# Patient Record
Sex: Female | Born: 2005 | Hispanic: Yes | Marital: Single
Health system: Southern US, Community
[De-identification: ages and names within clinical notes are randomized; demographics above are authoritative.]

## PROBLEM LIST (undated history)

## (undated) DIAGNOSIS — O24419 Gestational diabetes mellitus in pregnancy, unspecified control: Secondary | ICD-10-CM

## (undated) HISTORY — DX: Gestational diabetes mellitus in pregnancy, unspecified control: O24.419

---
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
## 2023-01-08 ENCOUNTER — Encounter: Payer: Self-pay | Admitting: Emergency Medicine

## 2023-01-08 ENCOUNTER — Emergency Department
Admission: EM | Admit: 2023-01-08 | Discharge: 2023-01-08 | Disposition: A | Discharge status: Home / Self Care | Payer: Medicaid Other | Attending: Emergency Medicine | Admitting: Emergency Medicine

## 2023-01-08 DIAGNOSIS — B009 Herpesviral infection, unspecified: Secondary | ICD-10-CM

## 2023-01-08 DIAGNOSIS — N3000 Acute cystitis without hematuria: Secondary | ICD-10-CM

## 2023-01-08 DIAGNOSIS — N898 Other specified noninflammatory disorders of vagina: Secondary | ICD-10-CM

## 2023-01-08 LAB — URINALYSIS, ROUTINE W REFLEX MICROSCOPIC
Bilirubin Urine: NEGATIVE
Glucose, UA: NEGATIVE mg/dL
Ketones, ur: NEGATIVE mg/dL
Nitrite: NEGATIVE
Protein, ur: 30 mg/dL — AB
Specific Gravity, Urine: 1.011 (ref 1.005–1.030)
WBC, UA: >50 WBC/hpf — ABNORMAL HIGH (ref 0–5)
pH: 6 (ref 5.0–8.0)

## 2023-01-08 LAB — CHLAMYDIA/NGC RT PCR (ARMC ONLY)
Chlamydia Tr: NOT DETECTED
N gonorrhoeae: NOT DETECTED

## 2023-01-08 LAB — WET PREP, GENITAL
Clue Cells Wet Prep HPF POC: NONE SEEN
Sperm: NONE SEEN
Trich, Wet Prep: NONE SEEN
WBC, Wet Prep HPF POC: <10 (ref <10)
Yeast Wet Prep HPF POC: NONE SEEN

## 2023-01-08 LAB — POC URINE PREG, ED: Preg Test, Ur: NEGATIVE

## 2023-01-08 MED ORDER — CEPHALEXIN 500 MG PO CAPS
500.0000 mg | ORAL_CAPSULE | Freq: Once | ORAL | Status: AC
  Administered 2023-01-08: 500 mg via ORAL
  Filled 2023-01-08: qty 1

## 2023-01-08 MED ORDER — VALACYCLOVIR HCL 1 G PO TABS: 1000.0000 mg | ORAL_TABLET | Freq: Two times a day (BID) | ORAL | 0 refills | Status: AC

## 2023-01-08 MED ORDER — VALACYCLOVIR HCL 500 MG PO TABS
1000.0000 mg | ORAL_TABLET | Freq: Once | ORAL | Status: AC
  Administered 2023-01-08: 1000 mg via ORAL
  Filled 2023-01-08: qty 2

## 2023-01-08 MED ORDER — CEPHALEXIN 500 MG PO CAPS: 500.0000 mg | ORAL_CAPSULE | Freq: Two times a day (BID) | ORAL | 0 refills | Status: AC

## 2023-01-08 NOTE — Discharge Instructions (Addendum)
Take both the antibiotic (cephalexin) for the UTI and the antiviral (valacyclovir) for the herpes and finish the full 1 week course of both.  Follow-up with a gynecologist.  We have provided referral information for 2 of our area gynecology practices, or you can follow-up with the health department.  Return to the ER for new, worsening, or persistent severe pain, bleeding, vaginal discharge, fever, or any other new or worsening symptoms that concern you.

## 2023-01-08 NOTE — ED Provider Notes (Signed)
[facility] Provider Note    Event Date/Time   First MD Initiated Contact with Patient 01/08/23 2128     (approximate)   History   Vaginal Discharge   HPI  Jenna [name] is a 17 y.o. female with no active medical problems who presents with vaginal pain and discharge for approximately last 4 days, persistent course.  The patient denies any associated vaginal bleeding.  She states that the discharge is white.  She also has pain to the outside of her vagina.  She denies any dysuria, hematuria, or frequency.  She has no abdominal pain or any fever or chills.  The patient states she is sexually active with 1 partner.  I reviewed the past medical records.  The patient was admitted last September and gave birth on 9/9.  She has no other recent ED visits or admissions.  Physical Exam   Triage Vital Signs: ED Triage Vitals  Enc Vitals Group     BP 01/08/23 2102 107/75     Pulse Rate 01/08/23 2102 82     Resp 01/08/23 2102 18     Temp 01/08/23 2102 98.2 F (36.8 C)     Temp Source 01/08/23 2102 Oral     SpO2 01/08/23 2102 100 %     Weight 01/08/23 2101 190 lb (86.2 kg)     Height 01/08/23 2101 5\' 5"  (1.651 m)     Head Circumference --      Peak Flow --      Pain Score 01/08/23 2101 10     Pain Loc --      Pain Edu? --      Excl. in [place]? --     Most recent vital signs: Vitals:   01/08/23 2102  BP: 107/75  Pulse: 82  Resp: 18  Temp: 98.2 F (36.8 C)  SpO2: 100%     General: Awake, no distress.  CV:  Good peripheral perfusion.  Resp:  Normal effort.  Abd:  Soft and nontender.  No distention.  Other:  Vesicular lesions on right labia.  Scant clear/white discharge.  No CMT or adnexal tenderness.   ED Results / Procedures / Treatments   Labs (all labs ordered are listed, but only abnormal results are displayed) Labs Reviewed  URINALYSIS, ROUTINE W REFLEX MICROSCOPIC - Abnormal; Notable for the following components:      Result  Value   Color, Urine YELLOW (*)    APPearance CLOUDY (*)    Hgb urine dipstick SMALL (*)    Protein, ur 30 (*)    Leukocytes,Ua LARGE (*)    WBC, UA >50 (*)    Bacteria, UA MANY (*)    All other components within normal limits  WET PREP, GENITAL  CHLAMYDIA/NGC RT PCR ([facility] ONLY)            POC URINE PREG, ED     EKG     RADIOLOGY    PROCEDURES:  Critical Care performed: No  Procedures   MEDICATIONS ORDERED IN ED: Medications  cephALEXin (KEFLEX) capsule 500 mg (has no administration in time range)  valACYclovir (VALTREX) tablet 1,000 mg (has no administration in time range)     IMPRESSION / MDM / ASSESSMENT AND PLAN / ED COURSE  I reviewed the triage vital signs and the nursing notes.  17 year old female with PMH as noted above presents with vaginal pain and discharge for the last several days.  Exam reveals vesicular lesions to the labia consistent with  HSV.  Urinalysis also shows WBCs and bacteria consistent with UTI.  Wet prep and GC/CT are negative.  Urine pregnancy is negative.  Differential diagnosis includes, but is not limited to, HSV, UTI/cystitis.  Given the clear vesicular lesions on exam there is no indication for serum testing at this time.  We will treat empirically with valacyclovir for acute HSV as well as with Keflex for the UTI.  Patient's presentation is most consistent with acute complicated illness / injury requiring diagnostic workup.  At this time the patient is stable for discharge home.  I counseled her on the results of the workup and my exam findings as the treatment and follow-up plan, and she is in agreement.  I gave her strict return precautions and she expresses understanding.  FINAL CLINICAL IMPRESSION(S) / ED DIAGNOSES   Final diagnoses:  HSV-2 infection  Vaginal discharge  Acute cystitis without hematuria     Rx / DC Orders   ED Discharge Orders          Ordered    cephALEXin (KEFLEX) 500 MG capsule  2 times daily         01/08/23 2301    valACYclovir (VALTREX) 1000 MG tablet  2 times daily        01/08/23 2301             Note:  This document was prepared using Dragon voice recognition software and may include unintentional dictation errors.    Arta Silence, MD 01/08/23 [phone]

## 2023-01-08 NOTE — ED Triage Notes (Signed)
Pt presents via POV with complaints of vaginal discharge, irritation, and dysuria for the last 2-3 days. Pt states she has had pain with sex last night around her labia. Denies hematuria, N/V/D, fevers, chills, CP or SOB.

## 2023-02-19 ENCOUNTER — Ambulatory Visit: Payer: Medicaid Other

## 2023-05-20 ENCOUNTER — Ambulatory Visit: Payer: Medicaid Other

## 2023-09-11 ENCOUNTER — Encounter: Payer: Self-pay | Admitting: Emergency Medicine

## 2023-09-11 ENCOUNTER — Emergency Department
Admission: EM | Admit: 2023-09-11 | Discharge: 2023-09-11 | Disposition: A | Discharge status: Home / Self Care | Payer: MEDICAID | Attending: Emergency Medicine | Admitting: Emergency Medicine

## 2023-09-11 ENCOUNTER — Other Ambulatory Visit: Payer: Self-pay

## 2023-09-11 DIAGNOSIS — Z202 Contact with and (suspected) exposure to infections with a predominantly sexual mode of transmission: Secondary | ICD-10-CM | POA: Insufficient documentation

## 2023-09-11 DIAGNOSIS — N760 Acute vaginitis: Secondary | ICD-10-CM | POA: Insufficient documentation

## 2023-09-11 DIAGNOSIS — N390 Urinary tract infection, site not specified: Secondary | ICD-10-CM | POA: Diagnosis not present

## 2023-09-11 DIAGNOSIS — B9689 Other specified bacterial agents as the cause of diseases classified elsewhere: Secondary | ICD-10-CM

## 2023-09-11 DIAGNOSIS — N7689 Other specified inflammation of vagina and vulva: Secondary | ICD-10-CM | POA: Diagnosis present

## 2023-09-11 DIAGNOSIS — A749 Chlamydial infection, unspecified: Secondary | ICD-10-CM | POA: Diagnosis not present

## 2023-09-11 LAB — URINALYSIS, ROUTINE W REFLEX MICROSCOPIC
Bilirubin Urine: NEGATIVE
Glucose, UA: NEGATIVE mg/dL
Hgb urine dipstick: NEGATIVE
Ketones, ur: NEGATIVE mg/dL
Nitrite: NEGATIVE
Protein, ur: 100 mg/dL — AB
Specific Gravity, Urine: 1.026 (ref 1.005–1.030)
WBC, UA: >50 WBC/hpf (ref 0–5)
pH: 6 (ref 5.0–8.0)

## 2023-09-11 LAB — WET PREP, GENITAL
Sperm: NONE SEEN
Trich, Wet Prep: NONE SEEN
WBC, Wet Prep HPF POC: <10 (ref <10)
Yeast Wet Prep HPF POC: NONE SEEN

## 2023-09-11 LAB — CHLAMYDIA/NGC RT PCR (ARMC ONLY)
Chlamydia Tr: DETECTED — AB
N gonorrhoeae: NOT DETECTED

## 2023-09-11 LAB — POC URINE PREG, ED: Preg Test, Ur: NEGATIVE

## 2023-09-11 MED ORDER — AZITHROMYCIN 1 G PO PACK
1.0000 g | PACK | Freq: Once | ORAL | Status: AC
  Administered 2023-09-11: 1 g via ORAL
  Filled 2023-09-11: qty 1

## 2023-09-11 MED ORDER — ONDANSETRON 4 MG PO TBDP
4.0000 mg | ORAL_TABLET | Freq: Once | ORAL | Status: AC
  Administered 2023-09-11: 4 mg via ORAL
  Filled 2023-09-11: qty 1

## 2023-09-11 MED ORDER — METRONIDAZOLE 500 MG PO TABS
500.0000 mg | ORAL_TABLET | Freq: Once | ORAL | Status: AC
  Administered 2023-09-11: 500 mg via ORAL

## 2023-09-11 MED ORDER — METRONIDAZOLE 500 MG PO TABS: 500.0000 mg | ORAL_TABLET | Freq: Two times a day (BID) | ORAL | 0 refills | Status: DC

## 2023-09-11 MED ORDER — CEFTRIAXONE SODIUM 500 MG IJ SOLR
500.0000 mg | Freq: Once | INTRAMUSCULAR | Status: DC
  Filled 2023-09-11: qty 500

## 2023-09-11 MED ORDER — METRONIDAZOLE 500 MG PO TABS
2000.0000 mg | ORAL_TABLET | Freq: Once | ORAL | Status: DC
  Filled 2023-09-11: qty 4

## 2023-09-11 NOTE — ED Triage Notes (Signed)
Patient ambulatory to triage with steady gait, without difficulty or distress noted; pt reports for several mos having vag/rectal itching with "fishy odor"

## 2023-09-11 NOTE — ED Provider Notes (Signed)
[facility] Provider Note    Event Date/Time   First MD Initiated Contact with Patient 09/11/23 [phone]     (approximate)   History   Vaginal Itching   HPI  Jenna [name] is a 17 y.o. female who presents to the ED from home with a chief complaint of vaginal itching and odor.  Patient reports vaginal itching times months.  Recently with fishy odor exacerbated by having sexual intercourse.  She is concerned for STD exposure.  Denies fever/chills, abdominal/pelvic pain, nausea/vomiting.  Reports her "butt hole itches".     Past Medical History   Past Medical History:  Diagnosis Date   Bipolar 1 disorder (HCC) dx'd 2019 07/11/2021   Diet controlled gestational diabetes mellitus (GDM) in third trimester 07/26/2022   Gestational diabetes      Active Problem List   Patient Active Problem List   Diagnosis Date Noted   NSVD (normal spontaneous vaginal delivery) 09/10/2022   Susceptible to varicella (non-immune), currently pregnant 06/19/2022   Rubella non-immune status, antepartum 06/19/2022   History of maternal gonorrhea, currently pregnant in second trimester 04/22/2022   Supervision of normal first teen pregnancy in third trimester 04/04/2022   Morbid obesity (HCC) 186 lbs 07/11/2021   Vapes nicotine containing substance 07/11/2021   Bipolar 1 disorder (HCC) dx'd 2019 07/11/2021     Past Surgical History  History reviewed. No pertinent surgical history.   Home Medications   Prior to Admission medications   Medication Sig Start Date End Date Taking? Authorizing Provider  metroNIDAZOLE (FLAGYL) 500 MG tablet Take 1 tablet (500 mg total) by mouth 2 (two) times daily. 09/11/23  Yes Irean Hong, MD  acetaminophen (TYLENOL) 325 MG tablet Take 2 tablets (650 mg total) by mouth every 4 (four) hours as needed (for pain scale < 4). 09/12/22   Gustavo Lah, CNM  ibuprofen (ADVIL) 600 MG tablet Take 1 tablet (600 mg total) by mouth every 6  (six) hours as needed for mild pain or cramping. 09/12/22   Gustavo Lah, CNM  Prenatal Vit-Fe Fumarate-FA (PRENATAL PO) Take 1 tablet by mouth daily.    [provider]     Allergies  Patient has no known allergies.   Family History   Family History  Problem Relation Age of Onset   Diabetes Mother    Diabetes Father    Diabetes Maternal Grandmother    Hypertension Maternal Grandmother      Physical Exam  Triage Vital Signs: ED Triage Vitals  Encounter Vitals Group     BP 09/11/23 0116 121/83     Systolic BP Percentile --      Diastolic BP Percentile --      Pulse Rate 09/11/23 0116 88     Resp 09/11/23 0116 18     Temp 09/11/23 0116 98.3 F (36.8 C)     Temp Source 09/11/23 0116 Oral     SpO2 09/11/23 0116 99 %     Weight --      Height 09/11/23 0115 5\' 5"  (1.651 m)     Head Circumference --      Peak Flow --      Pain Score 09/11/23 0115 0     Pain Loc --      Pain Education --      Exclude from Growth Chart --     Updated Vital Signs: BP 121/83 (BP Location: Left Arm)   Pulse 88   Temp 98.3 F (36.8 C) (  Oral)   Resp 18   Ht 5\' 5"  (1.651 m)   LMP 09/11/2023 (Exact Date)   SpO2 99%   BMI 31.62 kg/m    General: Awake, no distress.  CV:  Good peripheral perfusion.  Resp:  Normal effort.  Abd:  Nontender to light or deep palpation.  No distention.  Other:  External rectal exam unremarkable without lesions.   ED Results / Procedures / Treatments  Labs (all labs ordered are listed, but only abnormal results are displayed) Labs Reviewed  WET PREP, GENITAL - Abnormal; Notable for the following components:      Result Value   Clue Cells Wet Prep HPF POC PRESENT (*)    All other components within normal limits  CHLAMYDIA/NGC RT PCR ([facility] ONLY)           - Abnormal; Notable for the following components:   Chlamydia Tr DETECTED (*)    All other components within normal limits  URINALYSIS, ROUTINE W REFLEX MICROSCOPIC - Abnormal; Notable for  the following components:   Color, Urine YELLOW (*)    APPearance CLOUDY (*)    Protein, ur 100 (*)    Leukocytes,Ua SMALL (*)    Bacteria, UA MANY (*)    All other components within normal limits  POC URINE PREG, ED     EKG  None   RADIOLOGY None   Official radiology report(s): No results found.   PROCEDURES:  Critical Care performed: No  Procedures   MEDICATIONS ORDERED IN ED: Medications  azithromycin (ZITHROMAX) powder 1 g (1 g Oral Given 09/11/23 0309)  ondansetron (ZOFRAN-ODT) disintegrating tablet 4 mg (4 mg Oral Given 09/11/23 0309)  metroNIDAZOLE (FLAGYL) tablet 500 mg (500 mg Oral Given 09/11/23 0309)     IMPRESSION / MDM / ASSESSMENT AND PLAN / ED COURSE  I reviewed the triage vital signs and the nursing notes.                             17 year old female presenting with vaginal itching and discharge.  UA demonstrates small leukocyte positive UTI.  Prep demonstrates clue cells.  Chlamydia/gonorrhea pending.  Will treat empirically based on patient's STD concerns.  She will check MyChart for those results.  Strict return precautions given.  Patient verbalizes understanding and agrees with plan of care.  Patient's presentation is most consistent with acute, uncomplicated illness.   Clinical Course as of 09/11/23 0315  Thu Sep 11, 2023  2956 DNA came back; patient is positive for chlamydia.  Have adjusted medications and updated patient.  Have asked her to inform her sexual partner(s) to seek treatment at the health department. [JS]    Clinical Course User Index [JS] Irean Hong, MD     FINAL CLINICAL IMPRESSION(S) / ED DIAGNOSES   Final diagnoses:  Urinary tract infection without hematuria, site unspecified  Bacterial vaginosis  STD exposure  Chlamydia     Rx / DC Orders   ED Discharge Orders          Ordered    metroNIDAZOLE (FLAGYL) 500 MG tablet  2 times daily        09/11/23 0257             Note:  This document was  prepared using Dragon voice recognition software and may include unintentional dictation errors.   Irean Hong, MD 09/11/23 [phone]

## 2023-09-11 NOTE — Discharge Instructions (Signed)
You may check your STD results in MyChart.  Take and finish antibiotic as prescribed  (Flagyl 500 mg twice daily x 7 days).  Return to the ER for worsening symptoms, persistent vomiting, fever or other concerns.

## 2023-09-24 ENCOUNTER — Emergency Department
Admission: EM | Admit: 2023-09-24 | Discharge: 2023-09-24 | Disposition: A | Discharge status: Home / Self Care | Payer: MEDICAID | Attending: Emergency Medicine | Admitting: Emergency Medicine

## 2023-09-24 ENCOUNTER — Other Ambulatory Visit: Payer: Self-pay

## 2023-09-24 DIAGNOSIS — R21 Rash and other nonspecific skin eruption: Secondary | ICD-10-CM | POA: Insufficient documentation

## 2023-09-24 MED ORDER — PREDNISONE 10 MG PO TABS: ORAL_TABLET | ORAL | 0 refills | Status: AC

## 2023-09-24 NOTE — ED Triage Notes (Signed)
Pt to ED for rash to whole body x2 days. +itching.

## 2023-09-24 NOTE — ED Notes (Signed)
See triage note  Presents with generalized rash  Areas noted to torso and extremities  Positive itching

## 2023-09-24 NOTE — ED Provider Notes (Signed)
   [facility] Provider Note    Event Date/Time   First MD Initiated Contact with Patient 09/24/23 0732     (approximate)   History   Rash   HPI  Jenna [name] is a 17 y.o. female who presents with an itchy rash which has been present for nearly a week at this point, she reports it is diffuse, she questions whether could be bug bites.  Does have a history of psoriasis when she was younger.  No fevers or chills.  Overall feels well besides pruritus     Physical Exam   Triage Vital Signs: ED Triage Vitals  Encounter Vitals Group     BP 09/24/23 0728 136/80     Systolic BP Percentile --      Diastolic BP Percentile --      Pulse Rate 09/24/23 0728 80     Resp 09/24/23 0728 18     Temp 09/24/23 0728 97.9 F (36.6 C)     Temp src --      SpO2 09/24/23 0728 96 %     Weight 09/24/23 0729 77.1 kg (170 lb)     Height 09/24/23 0729 1.651 m (5\' 5" )     Head Circumference --      Peak Flow --      Pain Score 09/24/23 0729 0     Pain Loc --      Pain Education --      Exclude from Growth Chart --     Most recent vital signs: Vitals:   09/24/23 0728  BP: 136/80  Pulse: 80  Resp: 18  Temp: 97.9 F (36.6 C)  SpO2: 96%     General: Awake, no distress.  CV:  Good peripheral perfusion.  Resp:  Normal effort.  Abd:  No distention.  Other:  Diffuse nonspecific skin eruption, not consistent with pox, does not include the hands or the soles, no intraoral lesions, more confluence on the forehead with multiple areas of dry skin   ED Results / Procedures / Treatments   Labs (all labs ordered are listed, but only abnormal results are displayed) Labs Reviewed - No data to display   EKG     RADIOLOGY     PROCEDURES:  Critical Care performed:   Procedures   MEDICATIONS ORDERED IN ED: Medications - No data to display   IMPRESSION / MDM / ASSESSMENT AND PLAN / ED COURSE  I reviewed the triage vital signs and the nursing  notes. Patient's presentation is most consistent with acute, uncomplicated illness.  Nonspecific skin eruption, pruritic.  Patient is well-appearing and in no acute distress.  Not consistent with Stevens-Johnson, TENS, pox  Will trial steroid taper, outpatient follow-up with dermatology        FINAL CLINICAL IMPRESSION(S) / ED DIAGNOSES   Final diagnoses:  Rash and nonspecific skin eruption     Rx / DC Orders   ED Discharge Orders          Ordered    predniSONE (DELTASONE) 10 MG tablet  Daily        09/24/23 0748             Note:  This document was prepared using Dragon voice recognition software and may include unintentional dictation errors.   Jene Every, MD 09/24/23 [phone]

## 2023-11-07 ENCOUNTER — Emergency Department
Admission: EM | Admit: 2023-11-07 | Discharge: 2023-11-07 | Disposition: A | Discharge status: Home / Self Care | Payer: MEDICAID | Attending: Student in an Organized Health Care Education/Training Program | Admitting: Student in an Organized Health Care Education/Training Program

## 2023-11-07 ENCOUNTER — Encounter: Payer: Self-pay | Admitting: Intensive Care

## 2023-11-07 ENCOUNTER — Other Ambulatory Visit: Payer: Self-pay

## 2023-11-07 DIAGNOSIS — T40601A Poisoning by unspecified narcotics, accidental (unintentional), initial encounter: Secondary | ICD-10-CM | POA: Insufficient documentation

## 2023-11-07 DIAGNOSIS — R21 Rash and other nonspecific skin eruption: Secondary | ICD-10-CM | POA: Diagnosis not present

## 2023-11-07 LAB — COMPREHENSIVE METABOLIC PANEL
ALT: 18 U/L (ref 0–44)
AST: 17 U/L (ref 15–41)
Albumin: 4.3 g/dL (ref 3.5–5.0)
Alkaline Phosphatase: 64 U/L (ref 38–126)
Anion gap: 11 (ref 5–15)
BUN: 11 mg/dL (ref 6–20)
CO2: 21 mmol/L — ABNORMAL LOW (ref 22–32)
Calcium: 8.7 mg/dL — ABNORMAL LOW (ref 8.9–10.3)
Chloride: 103 mmol/L (ref 98–111)
Creatinine, Ser: 0.41 mg/dL — ABNORMAL LOW (ref 0.44–1.00)
GFR, Estimated: >60 mL/min (ref >60)
Glucose, Bld: 157 mg/dL — ABNORMAL HIGH (ref 70–99)
Potassium: 3.9 mmol/L (ref 3.5–5.1)
Sodium: 135 mmol/L (ref 135–145)
Total Bilirubin: 0.2 mg/dL (ref <1.2)
Total Protein: 7.6 g/dL (ref 6.5–8.1)

## 2023-11-07 LAB — CBC
HCT: 41.3 % (ref 36.0–46.0)
Hemoglobin: 13.9 g/dL (ref 12.0–15.0)
MCH: 30 pg (ref 26.0–34.0)
MCHC: 33.7 g/dL (ref 30.0–36.0)
MCV: 89 fL (ref 80.0–100.0)
Platelets: 291 10*3/uL (ref 150–400)
RBC: 4.64 MIL/uL (ref 3.87–5.11)
RDW: 13.7 % (ref 11.5–15.5)
WBC: 15.8 10*3/uL — ABNORMAL HIGH (ref 4.0–10.5)
nRBC: 0 % (ref 0.0–0.2)

## 2023-11-07 MED ORDER — NALOXONE HCL 4 MG/0.1ML NA LIQD
NASAL | 0 refills | Status: AC
Start: 2023-11-07 — End: ?

## 2023-11-07 MED ORDER — PREDNISONE 10 MG PO TABS: 10.0000 mg | ORAL_TABLET | Freq: Every day | ORAL | 0 refills | Status: DC

## 2023-11-07 MED ORDER — NALOXONE HCL 4 MG/0.1ML NA LIQD
1.0000 | Freq: Once | NASAL | Status: AC
  Administered 2023-11-07: 1 via NASAL
  Filled 2023-11-07: qty 4

## 2023-11-07 NOTE — ED Notes (Signed)
See triage note  Presents with possible drug OD  unsure of what she was given   Pt is sleepy but arouses

## 2023-11-07 NOTE — ED Provider Notes (Signed)
[facility] Provider Note    Event Date/Time   First MD Initiated Contact with Patient 11/07/23 1118     (approximate)   History   Drug Overdose   HPI  Jenna [name] is a 17 y.o. female history of substance abuse presents to the ER after accidental overdose of fentanyl.  Did receive Narcan with improvement.  States that she thought she was using cocaine and was only trying to get high.  No intent for self-harm.  Took the fentanyl about 2 hours ago was given Narcan roughly 1 hour ago and is starting to feel drowsy again sleepy.     Physical Exam   Triage Vital Signs: ED Triage Vitals  Encounter Vitals Group     BP 11/07/23 0957 125/86     Systolic BP Percentile --      Diastolic BP Percentile --      Pulse Rate 11/07/23 0957 (!) 55     Resp 11/07/23 0957 16     Temp 11/07/23 0957 97.6 F (36.4 C)     Temp Source 11/07/23 0957 Oral     SpO2 11/07/23 0957 97 %     Weight 11/07/23 1000 160 lb (72.6 kg)     Height 11/07/23 1000 5\' 3"  (1.6 m)     Head Circumference --      Peak Flow --      Pain Score 11/07/23 0959 0     Pain Loc --      Pain Education --      Exclude from Growth Chart --     Most recent vital signs: Vitals:   11/07/23 0957  BP: 125/86  Pulse: (!) 55  Resp: 16  Temp: 97.6 F (36.4 C)  SpO2: 97%     Constitutional: drowsy  Eyes: Conjunctivae are normal.  Head: Atraumatic. Nose: No congestion/rhinnorhea. Mouth/Throat: Mucous membranes are moist.   Neck: Painless ROM.  Cardiovascular:   Good peripheral circulation. Respiratory: Normal respiratory effort.  No retractions.  Gastrointestinal: Soft and nontender.  Musculoskeletal:  no deformity Neurologic:  MAE spontaneously. No gross focal neurologic deficits are appreciated.  Skin:  Skin is warm, dry and intact. No rash noted. Psychiatric: calm, cooperative    ED Results / Procedures / Treatments   Labs (all labs ordered are listed, but only  abnormal results are displayed) Labs Reviewed  COMPREHENSIVE METABOLIC PANEL - Abnormal; Notable for the following components:      Result Value   CO2 21 (*)    Glucose, Bld 157 (*)    Creatinine, Ser 0.41 (*)    Calcium 8.7 (*)    All other components within normal limits  CBC - Abnormal; Notable for the following components:   WBC 15.8 (*)    All other components within normal limits  URINE DRUG SCREEN, QUALITATIVE ([facility] ONLY)  ACETAMINOPHEN LEVEL  SALICYLATE LEVEL  ETHANOL  CBG MONITORING, ED  POC URINE PREG, ED     EKG  ED ECG REPORT I, Willy Eddy, the attending physician, personally viewed and interpreted this ECG.   Date: 11/07/2023  EKG Time: 10:29  Rate: 55  Rhythm: sinus  Axis: normal  Intervals:normal  ST&T Change: no stemi, no depressions    RADIOLOGY    PROCEDURES:  Critical Care performed: No  Procedures   MEDICATIONS ORDERED IN ED: Medications - No data to display   IMPRESSION / MDM / ASSESSMENT AND PLAN / ED COURSE  I reviewed the triage vital signs and  the nursing notes.                              Differential diagnosis includes, but is not limited to, overdose, recreational misadventure, dysrhythmia  Patient presenting to the ER for evaluation of symptoms as described above.  Based on symptoms, risk factors and considered above differential, this presenting complaint could reflect a potentially life-threatening illness therefore the patient will be placed on continuous pulse oximetry and telemetry for monitoring.  Laboratory evaluation will be sent to evaluate for the above complaints.  Patient is becoming drowsy again.  Will give intranasal Narcan and observe.    Clinical Course as of 11/07/23 1810  Fri Nov 07, 2023  1325 Patient more alert now.  I do believe at this point she has metabolized the effects of her accidental ingestion of fentanyl.  Reiterates that this was recreational misadventure.  She is also concerned about a  rash that she has had on her whole body for several months.  Has been trying to get into dermatology clinic but has not been successful.  No fevers.  Does have diffuse silvery plaque-like patches on trunk bilateral extremities and face.  Does appear like psoriasis.  No mucosal lesions.  Does not appear infectious send no palmar or plantar involvement.  Will place on prednisone.  Discussed importance of follow-up with dermatology. [PR]    Clinical Course User Index [PR] Willy Eddy, MD     FINAL CLINICAL IMPRESSION(S) / ED DIAGNOSES   Final diagnoses:  None     Rx / DC Orders   ED Discharge Orders     None        Note:  This document was prepared using Dragon voice recognition software and may include unintentional dictation errors.    Willy Eddy, MD 11/07/23 [phone]

## 2023-11-07 NOTE — ED Triage Notes (Signed)
Patient presents with drug overdose. Friend reports it may have been fentanyl but patient thought she was taking cocaine. Patient is unsure how much she took.

## 2024-02-27 ENCOUNTER — Emergency Department: Payer: MEDICAID

## 2024-02-27 ENCOUNTER — Emergency Department: Admission: EM | Admit: 2024-02-27 | Payer: MEDICAID | Source: Other Acute Inpatient Hospital

## 2024-02-27 ENCOUNTER — Emergency Department
Admission: EM | Admit: 2024-02-27 | Discharge: 2024-03-02 | Disposition: A | Discharge status: Other Acute Inpatient Hospital | Payer: MEDICAID | Attending: Emergency Medicine | Admitting: Emergency Medicine

## 2024-02-27 DIAGNOSIS — F23 Brief psychotic disorder: Secondary | ICD-10-CM | POA: Insufficient documentation

## 2024-02-27 DIAGNOSIS — F19959 Other psychoactive substance use, unspecified with psychoactive substance-induced psychotic disorder, unspecified: Secondary | ICD-10-CM | POA: Insufficient documentation

## 2024-02-27 DIAGNOSIS — R000 Tachycardia, unspecified: Secondary | ICD-10-CM | POA: Insufficient documentation

## 2024-02-27 DIAGNOSIS — F19259 Other psychoactive substance dependence with psychoactive substance-induced psychotic disorder, unspecified: Secondary | ICD-10-CM | POA: Insufficient documentation

## 2024-02-27 DIAGNOSIS — F29 Unspecified psychosis not due to a substance or known physiological condition: Secondary | ICD-10-CM | POA: Insufficient documentation

## 2024-02-27 DIAGNOSIS — I959 Hypotension, unspecified: Secondary | ICD-10-CM | POA: Diagnosis not present

## 2024-02-27 DIAGNOSIS — E876 Hypokalemia: Secondary | ICD-10-CM | POA: Insufficient documentation

## 2024-02-27 LAB — COMPREHENSIVE METABOLIC PANEL
ALT: 17 U/L (ref 0–44)
AST: 26 U/L (ref 15–41)
Albumin: 4.4 g/dL (ref 3.5–5.0)
Alkaline Phosphatase: 57 U/L (ref 38–126)
Anion gap: 17 — ABNORMAL HIGH (ref 5–15)
BUN: 17 mg/dL (ref 6–20)
CO2: 17 mmol/L — ABNORMAL LOW (ref 22–32)
Calcium: 9.4 mg/dL (ref 8.9–10.3)
Chloride: 109 mmol/L (ref 98–111)
Creatinine, Ser: 1.22 mg/dL — ABNORMAL HIGH (ref 0.44–1.00)
GFR, Estimated: >60 mL/min (ref >60)
Glucose, Bld: 171 mg/dL — ABNORMAL HIGH (ref 70–99)
Potassium: 3.2 mmol/L — ABNORMAL LOW (ref 3.5–5.1)
Sodium: 143 mmol/L (ref 135–145)
Total Bilirubin: 0.6 mg/dL (ref 0.0–1.2)
Total Protein: 7.7 g/dL (ref 6.5–8.1)

## 2024-02-27 LAB — CBC
HCT: 41.5 % (ref 36.0–46.0)
Hemoglobin: 14.3 g/dL (ref 12.0–15.0)
MCH: 30.1 pg (ref 26.0–34.0)
MCHC: 34.5 g/dL (ref 30.0–36.0)
MCV: 87.4 fL (ref 80.0–100.0)
Platelets: 274 10*3/uL (ref 150–400)
RBC: 4.75 MIL/uL (ref 3.87–5.11)
RDW: 12.9 % (ref 11.5–15.5)
WBC: 9.6 10*3/uL (ref 4.0–10.5)
nRBC: 0 % (ref 0.0–0.2)

## 2024-02-27 LAB — ACETAMINOPHEN LEVEL: Acetaminophen (Tylenol), Serum: <10 ug/mL — ABNORMAL LOW (ref 10–30)

## 2024-02-27 LAB — SALICYLATE LEVEL: Salicylate Lvl: <7 mg/dL — ABNORMAL LOW (ref 7.0–30.0)

## 2024-02-27 LAB — ETHANOL: Alcohol, Ethyl (B): <10 mg/dL (ref <10)

## 2024-02-27 MED ORDER — HALOPERIDOL LACTATE 5 MG/ML IJ SOLN
INTRAMUSCULAR | Status: AC
  Administered 2024-02-27: 5 mg via INTRAMUSCULAR
  Filled 2024-02-27: qty 1

## 2024-02-27 MED ORDER — LORAZEPAM 2 MG/ML IJ SOLN
INTRAMUSCULAR | Status: AC
  Administered 2024-02-27: 2 mg via INTRAMUSCULAR
  Filled 2024-02-27: qty 1

## 2024-02-27 MED ORDER — HALOPERIDOL LACTATE 5 MG/ML IJ SOLN: 5.0000 mg | Freq: Once | INTRAMUSCULAR | Status: AC

## 2024-02-27 MED ORDER — SODIUM CHLORIDE 0.9 % IV BOLUS
1000.0000 mL | Freq: Once | INTRAVENOUS | Status: AC
  Administered 2024-02-27: 1000 mL via INTRAVENOUS

## 2024-02-27 MED ORDER — LORAZEPAM 2 MG/ML IJ SOLN
2.0000 mg | Freq: Once | INTRAMUSCULAR | Status: AC
Start: 2024-02-27 — End: 2024-02-27

## 2024-02-27 NOTE — Consult Note (Incomplete)
 [facility] Psychiatric Consult Initial  Patient Name: .Jenna [name]  MRN: [id number]  DOB: May 27, 2006  Consult Order details:  Orders (From admission, onward)     Start     Ordered   02/27/24 2248  CONSULT TO CALL ACT TEAM       Ordering Provider: Janith Lima, MD  Provider:  (Not yet assigned)  Question:  Reason for Consult?  Answer:  Psych consult   02/27/24 2247   02/27/24 2247  IP CONSULT TO PSYCHIATRY       Ordering Provider: Janith Lima, MD  Provider:  (Not yet assigned)  Question Answer Comment  Place call to: psych   Reason for Consult Admit   Diagnosis/Clinical Info for Consult: acute psychosis, [place]      02/27/24 2247             Mode of Visit: Tele-visit Virtual Statement:TELE PSYCHIATRY ATTESTATION & CONSENT As the provider for this telehealth consult, I attest that I verified the patient's identity using two separate identifiers, introduced myself to the patient, provided my credentials, disclosed my location, and performed this encounter via a HIPAA-compliant, real-time, face-to-face, two-way, interactive audio and video platform and with the full consent and agreement of the patient (or guardian as applicable.) Patient physical location: [facility]. Telehealth provider physical location: home office in state of [place].   Video start time:   Video end time:      Psychiatry Consult Evaluation  Service Date: February 27, 2024 LOS:  LOS: 0 days  Chief Complaint   Primary Psychiatric Diagnoses  Acute psychosis (HCC)  Assessment    Diagnoses:  Active Hospital problems: Principal Problem:   Acute psychosis (HCC)    Plan   ## Psychiatric Medication Recommendations:  ***  ## Medical Decision Making Capacity: {CHL BH MEDICAL DECISION MAKING CAPACITY:31818}  ## Further Work-up:  -- *** {CHLmacgeneralandspecificworkuprecs:31821} -- most recent EKG on *** had QtC of *** -- Pertinent labwork reviewed earlier this admission includes:  ***   ## Disposition:-- {CHLmaccldispo:31820}  ## Behavioral / Environmental: -{CHLmacbehavioralenvironmental2:31847}    ## Safety and Observation Level:  - Based on my clinical evaluation, I estimate the patient to be at *** risk of self harm in the current setting. - At this time, we recommend  {CHL BH SUICIDE OBSERVATION LEVEL:31850}. This decision is based on my review of the chart including patient's history and current presentation, interview of the patient, mental status examination, and consideration of suicide risk including evaluating suicidal ideation, plan, intent, suicidal or self-harm behaviors, risk factors, and protective factors. This judgment is based on our ability to directly address suicide risk, implement suicide prevention strategies, and develop a safety plan while the patient is in the clinical setting. Please contact our team if there is a concern that risk level has changed.  CSSR Risk Category:   Suicide Risk Assessment: Patient has following modifiable risk factors for suicide: {CHLmacmodifiablesuicideriskfactors:31822}, which we are addressing by ***. Patient has following non-modifiable or demographic risk factors for suicide: {CHLmacnonmodifiablesuicideriskfactors:31823} Patient has the following protective factors against suicide: {CHLmacprotectivefactors:31824}  Thank you for this consult request. Recommendations have been communicated to the primary team.  We will *** at this time.   Jearld Lesch, NP       History of Present Illness  Relevant Aspects of Hospital [facility] or ED course:31819} Course:  Admitted on 02/27/2024 for ***. They ***.   Patient Report:  ***  Psych ROS:  Depression: *** Anxiety:  ***  Mania (lifetime and current): *** Psychosis: (lifetime and current): ***  Collateral information:  Contacted *** at *** on ***  Review of Systems  All other systems reviewed and are negative.    Psychiatric and Social History   Psychiatric History:  Information collected from ***  Prev Dx/Sx: *** Current Psych Provider: *** Home Meds (current): *** Previous Med Trials: *** Therapy: ***  Prior Psych Hospitalization: ***  Prior Self Harm: *** Prior Violence: ***  Family Psych History: *** Family Hx suicide: ***  Social History:  Developmental Hx: *** Educational Hx: *** Occupational Hx: *** Legal Hx: *** Living Situation: *** Spiritual Hx: *** Access to weapons/lethal means: ***   Substance History Alcohol: ***  Type of alcohol *** Last Drink *** Number of drinks per day *** History of alcohol withdrawal seizures *** History of DT's *** Tobacco: *** Illicit drugs: *** Prescription drug abuse: *** Rehab hx: ***  Exam Findings  Physical Exam: *** Vital Signs:  Pulse Rate:  [114] 114 (02/28 2330) Resp:  [19-52] 19 (02/28 2330) BP: (92-97)/(53-58) 97/54 (02/28 2330) SpO2:  [96 %-98 %] 98 % (02/28 2330) Blood pressure (!) 97/54, pulse (!) 114, resp. rate 19, SpO2 98%. There is no height or weight on file to calculate BMI.  Physical Exam Vitals and nursing note reviewed.     Mental Status Exam: General Appearance: {Appearance:22683}  Orientation:  {BHH ORIENTATION (PAA):22689}  Memory:  {BHH MEMORY:22881}  Concentration:  {Concentration:21399}  Recall:  {BHH GOOD/FAIR/POOR:22877}  Attention  {BH Attention Span:31825}  Eye Contact:  {BHH EYE CONTACT:22684}  Speech:  {Speech:22685}  Language:  {BHH GOOD/FAIR/POOR:22877}  Volume:  {Volume (PAA):22686}  Mood: ***  Affect:  {Affect (PAA):22687}  Thought Process:  {Thought Process (PAA):22688}  Thought Content:  {Thought Content:22690}  Suicidal Thoughts:  {ST/HT (PAA):22692}  Homicidal Thoughts:  {ST/HT (PAA):22692}  Judgement:  {Judgement (PAA):22694}  Insight:  {Insight (PAA):22695}  Psychomotor Activity:  {Psychomotor (PAA):22696}  Akathisia:  {BHH YES OR NO:22294}  Fund of Knowledge:  {BHH GOOD/FAIR/POOR:22877}       Assets:  {Assets (PAA):22698}  Cognition:  {chl bhh cognition:304700322}  ADL's:  {BHH ZOX'W:96045}  AIMS (if indicated):        Other History   These have been pulled in through the EMR, reviewed, and updated if appropriate.  Family History:  The patient's family history is not on file.  Medical History: No past medical history on file.  Surgical History: *** The histories are not reviewed yet. Please review them in the "History" navigator section and refresh this SmartLink.   Medications:  No current facility-administered medications for this encounter. No current outpatient medications on file.  Allergies: Not on File  Huma Imhoff Damaris Hippo, NP
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
## 2024-02-28 ENCOUNTER — Encounter: Payer: Self-pay | Admitting: Psychiatry

## 2024-02-28 DIAGNOSIS — F23 Brief psychotic disorder: Secondary | ICD-10-CM

## 2024-02-28 LAB — URINE DRUG SCREEN, QUALITATIVE (ARMC ONLY)
Amphetamines, Ur Screen: NOT DETECTED
Barbiturates, Ur Screen: NOT DETECTED
Benzodiazepine, Ur Scrn: NOT DETECTED
Cannabinoid 50 Ng, Ur ~~LOC~~: POSITIVE — AB
Cocaine Metabolite,Ur ~~LOC~~: POSITIVE — AB
MDMA (Ecstasy)Ur Screen: NOT DETECTED
Methadone Scn, Ur: NOT DETECTED
Opiate, Ur Screen: NOT DETECTED
Phencyclidine (PCP) Ur S: NOT DETECTED
Tricyclic, Ur Screen: NOT DETECTED

## 2024-02-28 NOTE — ED Notes (Signed)
 1 - Tan pants 1 - Black T-Shirt 2 - socks 1 - black jacket 1 - metal ring 2 - Black shoes  Belongings bagged, tagged, and placed on cart at nurses station.

## 2024-02-28 NOTE — ED Notes (Signed)
 Pt resting in bed.
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
## 2024-02-28 NOTE — ED Notes (Addendum)
 Patient up to nsg station, requested to use restroom and to have a drink, pt refused snack at this time.

## 2024-02-28 NOTE — BH Assessment (Signed)
Patient received agitation medications IM and continues to be asleep, unable to complete her assessment at this time. Psych team to follow up.

## 2024-02-28 NOTE — BH Assessment (Signed)
 TTS attempted to assess patient at bedside. When TTS arrived to bedside, pt refused to participate in assessment and asked if this writer would return later.  NT was present and aware of pt refusing to participate in assessment.  TTS will make attempt to assess patient at a later time.

## 2024-02-28 NOTE — ED Provider Notes (Signed)
 Emergency Medicine Observation Re-evaluation Note  Physical Exam   BP 104/68   Pulse (!) 105   Resp (!) 22   SpO2 98%   Patient appears in no acute distress.  ED Course / MDM   No reported events during my shift at the time of this note.   Pt is awaiting dispo from consultants   Pilar Jarvis MD    Pilar Jarvis, MD 02/28/24 [phone]

## 2024-02-28 NOTE — ED Notes (Signed)
 ivc by MD Wells/psych consult ordered/pending.

## 2024-02-28 NOTE — ED Notes (Signed)
 Pt provided with breakfast tray.

## 2024-02-28 NOTE — ED Notes (Signed)
 Pt is sleeping. Will try again later to give her a lunch tray when she awakens.

## 2024-02-28 NOTE — ED Notes (Signed)
Patient is IVC pending reassessment

## 2024-02-28 NOTE — ED Notes (Signed)
 Pt woke up and asked for water. This tech gave her a cup of water which she finished quickly. She asked for another one so this tech gave her another cup of water. This tech asked the pt if she wanted her lunch tray, but she said no.

## 2024-02-28 NOTE — ED Notes (Signed)
 ivc/Per NP Dixon unable to complete her assessment at this time. Psych team to follow up.

## 2024-02-29 LAB — PREGNANCY, URINE: Preg Test, Ur: NEGATIVE

## 2024-02-29 MED ORDER — POTASSIUM CHLORIDE 20 MEQ PO PACK
40.0000 meq | PACK | Freq: Once | ORAL | Status: AC
  Administered 2024-02-29: 40 meq via ORAL
  Filled 2024-02-29 (×2): qty 2

## 2024-02-29 NOTE — ED Notes (Signed)
 This tech spoke with brother and brother was concerned stating that pt does not have a home and if released pt would more than likely go right back out to the streets and keep doing drugs.

## 2024-02-29 NOTE — ED Notes (Signed)
 Patient is having tele/psych meeting at this time, no behavioral issues noted.

## 2024-02-29 NOTE — ED Notes (Signed)
 Patient is IVC still pending re-assessment

## 2024-02-29 NOTE — ED Notes (Signed)
 Pt is in dayroom with brother.

## 2024-02-29 NOTE — ED Provider Notes (Signed)
 Emergency Medicine Observation Re-evaluation Note  Jenna [name] Jenna [name] is a 18 y.o. female, seen on rounds today.  Pt initially presented to the ED for complaints of Psychiatric Evaluation  Currently, the patient is resting comfortably.  Physical Exam  BP 120/77   Pulse 98   Temp 98 F (36.7 C) (Oral)   Resp 15   SpO2 99%  General: No acute distress Cardiac: Well-perfused extremities Lungs: No respiratory distress Psych: Appropriate mood and affect  ED Course / MDM  EKG:   I have reviewed the labs performed to date as well as medications administered while in observation.  Recent changes in the last 24 hours include none.  Plan  Current plan is for placement.   Merwyn Katos, MD 02/29/24 [phone]
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
## 2024-02-29 NOTE — ED Notes (Signed)
 Patient had a visitor, family member, visit went well and was supervised by staff.

## 2024-02-29 NOTE — BH Assessment (Signed)
 Adult MH  Referral information for Psychiatric Hospitalization faxed to:   [facility] [phone]- [phone]),   [address] [phone]),   [name] [phone] -or- [phone]),   [facility] [phone]),   [facility] [phone] or [phone])   [facility] [phone] or [phone]),   [facility] [phone]).

## 2024-02-29 NOTE — ED Notes (Signed)
 RN to bedside to introduce self to pt. Pt sleeping at this time.

## 2024-02-29 NOTE — ED Notes (Signed)
Pt refused snack but accepted drink.

## 2024-02-29 NOTE — ED Notes (Signed)
 Mother Kathrine Cords) and brother Doreene Burke) would like to stay updated on patients status with pt consent if possible. Contact information in chart.
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
## 2024-02-29 NOTE — ED Notes (Signed)
 This tech spoke with mother after visit and mother voiced concerns for pt. Mother states that pt needs to be sent to a rehab facility and not released, says that daughter just told her that she is very desperate to leave so she can go and smoke. Mother states that pt has been into drugs since middle school and has no intentions of quitting.

## 2024-02-29 NOTE — ED Notes (Signed)
 Pt provided with lunch tray.

## 2024-03-01 DIAGNOSIS — F19959 Other psychoactive substance use, unspecified with psychoactive substance-induced psychotic disorder, unspecified: Secondary | ICD-10-CM | POA: Insufficient documentation

## 2024-03-01 MED ORDER — MELATONIN 5 MG PO TABS
5.0000 mg | ORAL_TABLET | Freq: Once | ORAL | Status: AC
  Administered 2024-03-01: 5 mg via ORAL
  Filled 2024-03-01: qty 1

## 2024-03-01 NOTE — ED Notes (Signed)
 IVC. Rec psych inpt when medically cleared

## 2024-03-01 NOTE — ED Provider Notes (Signed)
 Emergency Medicine Observation Re-evaluation Note  Jenna [name] [name] is a 18 y.o. female, seen on rounds today.  Pt initially presented to the ED for complaints of Psychiatric Evaluation Currently, the patient is awaiting dispo.  Physical Exam  BP 112/72   Pulse 89   Temp 98.5 F (36.9 C) (Oral)   Resp 18   SpO2 99%  Physical Exam General: resting comfortably   ED Course / MDM  No new labs in past 24 hours  Plan  Current plan is for dispo.    Phineas Semen, MD 03/01/24 [phone]

## 2024-03-01 NOTE — ED Notes (Signed)
Lunch tray provided for pt

## 2024-03-01 NOTE — ED Notes (Signed)
 This NT provided pt with pm snack.

## 2024-03-01 NOTE — ED Notes (Signed)
 IVC/  PENDING  PLACEMENT

## 2024-03-01 NOTE — ED Notes (Signed)
 Pt completed her Shower after given supplies
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
## 2024-03-01 NOTE — BH Assessment (Addendum)
 PATIENT BED AVAILABLE AFTER 8AM ON 03/02/24  Patient has been accepted to [facility].  Patient assigned to [facility] A-Unit Accepting physician is Dr. Sallyanne Kuster.  Call report to [phone].  Representative was Kia.   ER Staff is aware of it:  [facility] ER Secretary  Dr. Jodie Echevaria, ER MD  [facility]ahlia Client Patient's Nurse     Attempted to contact the patient's mother Betsey Amen [phone] but no answer. Update 9:30pm: patient's mother contacted this writer back, mother communicated that she cannot talk currently due to being at work and needs an interpretor to understand the transfer information. Mother reports that she gets off work at [employer]. Conservation officer, nature will attempt to contact mother back at 5am via video interpretor  Update 4:30am- Mother was contacted via video interpretor and was provided with the phone number of the facility, opportunity was given for mother to ask any questions. Mother is receptive.

## 2024-03-01 NOTE — ED Notes (Signed)
 Hospital meal provided.  100% consumed, pt tolerated w/o complaints.  Waste discarded appropriately.

## 2024-03-01 NOTE — BH Assessment (Signed)
 Adult MH  Referral information for Psychiatric Hospitalization faxed to:   [facility] [phone]- [phone]),   [address] [phone]),   [name] [phone] -or- [phone]),   [facility] [phone]),   [facility] [phone] or [phone])   [facility] [phone] or [phone]),   [facility] [phone]).

## 2024-03-01 NOTE — ED Notes (Signed)
 Patient given dinner tray at this time.

## 2024-03-02 ENCOUNTER — Encounter: Payer: Self-pay | Admitting: Intensive Care

## 2024-03-02 NOTE — ED Provider Notes (Addendum)
 Emergency Medicine Observation Re-evaluation Note  Jenna [name] [name] is a 18 y.o. female, seen on rounds today.  Pt initially presented to the ED for complaints of Psychiatric Evaluation Currently, the patient is resting comfortably.  Physical Exam  BP 109/67 (BP Location: Right Arm)   Pulse 74   Temp 98.8 F (37.1 C) (Oral)   Resp 16   SpO2 97%  Physical Exam General: No acute distress  ED Course / MDM  EKG:EKG Interpretation Date/Time:  Friday February 27 2024 22:23:17 EST Ventricular Rate:  126 PR Interval:  113 QRS Duration:  88 QT Interval:  324 QTC Calculation: 470 R Axis:   71  Text Interpretation: Sinus tachycardia Confirmed by UNCONFIRMED, DOCTOR ([id number]), editor Lonell Face [phone]) on 03/01/2024 7:38:04 AM  I have reviewed the labs performed to date as well as medications administered while in observation.  Recent changes in the last 24 hours include no acute events.  Plan  Current plan is for psychiatric disposition.    Janith Lima, MD 03/02/24 [phone]  Patient received admission to [facility] and transported there for further psychiatric care.   Janith Lima, MD 03/02/24 [phone]

## 2024-03-02 NOTE — ED Notes (Signed)
 Pt is A/Ox 3, Ms Jenna [name] declines any SI/HI stated that she is not currently having A/V hallucinations.  Pt made aware of transport to inpatient facility, she verbalized understanding.  All Belongings accounted for, given to [facility] for transport  Pt left ambulatory via [facility]

## 2024-03-02 NOTE — ED Notes (Signed)
 Report given to Meda Klinefelter, RN - [name] [phone], transport requested EDP notified

## 2024-04-14 ENCOUNTER — Other Ambulatory Visit: Payer: Self-pay

## 2024-04-14 ENCOUNTER — Emergency Department
Admission: EM | Admit: 2024-04-14 | Discharge: 2024-04-16 | Disposition: A | Discharge status: Other Acute Inpatient Hospital | Payer: MEDICAID | Attending: Emergency Medicine | Admitting: Emergency Medicine

## 2024-04-14 DIAGNOSIS — S59912A Unspecified injury of left forearm, initial encounter: Secondary | ICD-10-CM | POA: Diagnosis present

## 2024-04-14 DIAGNOSIS — S51812A Laceration without foreign body of left forearm, initial encounter: Secondary | ICD-10-CM | POA: Insufficient documentation

## 2024-04-14 DIAGNOSIS — F29 Unspecified psychosis not due to a substance or known physiological condition: Secondary | ICD-10-CM | POA: Diagnosis present

## 2024-04-14 DIAGNOSIS — X58XXXA Exposure to other specified factors, initial encounter: Secondary | ICD-10-CM | POA: Diagnosis not present

## 2024-04-14 LAB — CBC WITH DIFFERENTIAL/PLATELET
Abs Immature Granulocytes: 0.04 10*3/uL (ref 0.00–0.07)
Basophils Absolute: 0 10*3/uL (ref 0.0–0.1)
Basophils Relative: 0 %
Eosinophils Absolute: 0.1 10*3/uL (ref 0.0–0.5)
Eosinophils Relative: 1 %
HCT: 37 % (ref 36.0–46.0)
Hemoglobin: 13 g/dL (ref 12.0–15.0)
Immature Granulocytes: 0 %
Lymphocytes Relative: 15 %
Lymphs Abs: 1.5 10*3/uL (ref 0.7–4.0)
MCH: 30.2 pg (ref 26.0–34.0)
MCHC: 35.1 g/dL (ref 30.0–36.0)
MCV: 85.8 fL (ref 80.0–100.0)
Monocytes Absolute: 0.6 10*3/uL (ref 0.1–1.0)
Monocytes Relative: 6 %
Neutro Abs: 7.7 10*3/uL (ref 1.7–7.7)
Neutrophils Relative %: 78 %
Platelets: 259 10*3/uL (ref 150–400)
RBC: 4.31 MIL/uL (ref 3.87–5.11)
RDW: 12.4 % (ref 11.5–15.5)
WBC: 10 10*3/uL (ref 4.0–10.5)
nRBC: 0 % (ref 0.0–0.2)

## 2024-04-14 LAB — COMPREHENSIVE METABOLIC PANEL WITH GFR
ALT: 18 U/L (ref 0–44)
AST: 18 U/L (ref 15–41)
Albumin: 4 g/dL (ref 3.5–5.0)
Alkaline Phosphatase: 50 U/L (ref 38–126)
Anion gap: 7 (ref 5–15)
BUN: 20 mg/dL (ref 6–20)
CO2: 21 mmol/L — ABNORMAL LOW (ref 22–32)
Calcium: 9.2 mg/dL (ref 8.9–10.3)
Chloride: 111 mmol/L (ref 98–111)
Creatinine, Ser: 0.72 mg/dL (ref 0.44–1.00)
GFR, Estimated: >60 mL/min (ref >60)
Glucose, Bld: 118 mg/dL — ABNORMAL HIGH (ref 70–99)
Potassium: 3.2 mmol/L — ABNORMAL LOW (ref 3.5–5.1)
Sodium: 139 mmol/L (ref 135–145)
Total Bilirubin: 0.5 mg/dL (ref 0.0–1.2)
Total Protein: 7.1 g/dL (ref 6.5–8.1)

## 2024-04-14 LAB — ACETAMINOPHEN LEVEL: Acetaminophen (Tylenol), Serum: <10 ug/mL — ABNORMAL LOW (ref 10–30)

## 2024-04-14 LAB — SALICYLATE LEVEL: Salicylate Lvl: <7 mg/dL — ABNORMAL LOW (ref 7.0–30.0)

## 2024-04-14 LAB — ETHANOL: Alcohol, Ethyl (B): <10 mg/dL (ref <10)

## 2024-04-14 LAB — HCG, QUANTITATIVE, PREGNANCY: hCG, Beta Chain, Quant, S: <1 m[IU]/mL (ref <5)

## 2024-04-14 MED ORDER — MIDAZOLAM HCL 5 MG/5ML IJ SOLN
5.0000 mg | Freq: Once | INTRAMUSCULAR | Status: AC
Start: 2024-04-14 — End: 2024-04-14
  Administered 2024-04-14: 5 mg via INTRAMUSCULAR
  Filled 2024-04-14: qty 5

## 2024-04-14 MED ORDER — OLANZAPINE 5 MG PO TABS
5.0000 mg | ORAL_TABLET | Freq: Every day | ORAL | Status: DC
  Administered 2024-04-14 – 2024-04-15 (×2): 5 mg via ORAL
  Filled 2024-04-14 (×2): qty 1

## 2024-04-14 MED ORDER — DROPERIDOL 2.5 MG/ML IJ SOLN
5.0000 mg | Freq: Once | INTRAMUSCULAR | Status: AC
  Administered 2024-04-14: 5 mg via INTRAMUSCULAR
  Filled 2024-04-14: qty 2

## 2024-04-14 NOTE — BH Assessment (Signed)
 Comprehensive Clinical Assessment (CCA) Screening, Triage and Referral Note  04/14/2024 Jenna [name] [id number] Recommendations for Services/Supports/Treatments: Disposition pending. Jenna [name]. [name] is a 18 y.o Black, Not Hispanic or Latino ethnicity, ENGLISH speaking female who presented to the ED under IVC. Per triage note: Pt under IVC with [employer], pt was running down road hallucinating. Pt laughing and speaking random words in triage. Pt not redirectable or able to follow directions. Per [employer] pt was hallucinating when they picked her up.           Pt presented with disorganized thought processes and was unable to answer questions in a meaningful way. Pt had slurred speech and her responses were vague. Pt stated, "I just need my medicine." Pt denied substance use. Pt denied having anxiety/depression. Pt reported that she sometimes hallucinates. Pt reported seeing Jenna [name] characters. Pt was guarded. Pt had normal psychomotor activity. Pt had limited insight. Pt had impaired memory, explaining that she does not remember what happened prior to her arrival. The pt. denied current SI/HI/AV/H. Pt's BAL is unremarkable; UDS pending. Chief Complaint:  Chief Complaint  Patient presents with   Psychiatric Evaluation   Visit Diagnosis: Bipolar I disorder  Patient Reported Information How did you hear about [place]? No data recorded What Is the Reason for Your Visit/Call Today? No data recorded How Long Has This Been Causing You Problems? No data recorded What Do You Feel Would Help You the Most Today? No data recorded  Have You Recently Had Any Thoughts About Hurting Yourself? No data recorded Are You Planning to Commit Suicide/Harm Yourself At This time? No data recorded  Have you Recently Had Thoughts About Hurting Someone Jenna [name]? No data recorded Are You Planning to Harm Someone at This Time? No data recorded Explanation: No data recorded  Have You Used Any  Alcohol or Drugs in the Past 24 Hours? No data recorded How Long Ago Did You Use Drugs or Alcohol? No data recorded What Did You Use and How Much? No data recorded  Do You Currently Have a Therapist/Psychiatrist? No data recorded Name of Therapist/Psychiatrist: No data recorded  Have You Been Recently Discharged From Any Office Practice or Programs? No data recorded Explanation of Discharge From Practice/Program: No data recorded   CCA Screening Triage Referral Assessment Type of Contact: No data recorded Telemedicine Service Delivery:   Is this Initial or Reassessment?   Date Telepsych consult ordered in [facility]:    Time Telepsych consult ordered in [facility]:    Location of Assessment: No data recorded Provider Location: No data recorded   Collateral Involvement: No data recorded  Does Patient Have a Court Appointed Legal Guardian? No data recorded Name and Contact of Legal Guardian: No data recorded If Minor and Not Living with Parent(s), Who has Custody? No data recorded Is CPS involved or ever been involved? No data recorded Is APS involved or ever been involved? No data recorded  Patient Determined To Be At Risk for Harm To Self or Others Based on Review of Patient Reported Information or Presenting Complaint? No data recorded Method: No data recorded Availability of Means: No data recorded Intent: No data recorded Notification Required: No data recorded Additional Information for Danger to Others Potential: No data recorded Additional Comments for Danger to Others Potential: No data recorded Are There Guns or Other Weapons in Your Home? No data recorded Types of Guns/Weapons: No data recorded Are These Weapons Safely Secured?  No data recorded Who Could Verify You Are Able To Have These Secured: No data recorded Do You Have any Outstanding Charges, Pending Court Dates, Parole/Probation? No data recorded Contacted To Inform of Risk of Harm To Self or  Others: No data recorded  Does Patient Present under Involuntary Commitment? No data recorded   [place] of Residence: No data recorded  Patient Currently Receiving the Following Services: No data recorded  Determination of Need: No data recorded  Options For Referral: No data recorded  Disposition Recommendation per psychiatric provider: Pending  Jenna [name] R Abhijay Morriss, LCAS

## 2024-04-14 NOTE — ED Triage Notes (Signed)
 Pt under IVC with [employer], pt was running down road hallucinating. Pt laughing and speaking random words in triage. Pt not redirectable or able to follow directions. Per [employer] pt was hallucinating when they picked her up.

## 2024-04-14 NOTE — ED Notes (Signed)
 Ivc pending consult patient unable to do consult

## 2024-04-14 NOTE — ED Notes (Signed)
 RN attempted to assess pts orientation but pt refused to answer questions. TTS made aware that pt is alert and able to sit for consult.

## 2024-04-14 NOTE — ED Notes (Signed)
 Hospital meal provided, pt tolerated w/o complaints.  Waste discarded appropriately.

## 2024-04-14 NOTE — Consult Note (Addendum)
 Pt presented to ED around 0600 with [employer] under IVC due to running down the road, hallucinating, laughing inappropriately. Appeared to be RTIS upon hospital admission. She became agitated, was sedated with IM PRN medications of Droperidol 5 mg and Versed 5 mg around 0630. Pt has been sleeping/has not woken up since. Please see insert below by ED physician Dr. Margery Sheets from this morning,  "She is screaming out incoherently flailing all of her extremities and unable to cooperate with interview or exam. She is unable to be redirected. She is in handcuffs and throwing herself wildly about on the stretcher. At times she will begin singing. She appears to be talking to herself when left alone in the room, appearing to respond to internal stimuli."   Pt did have very similar presentation 02/27/24 where she presented with similar psychotic features, and tested positive for cocaine and THC. Pt rapidly cleared while in the ED without scheduled medications, psychiatry suspected substance induced psychosis.  Pt has been unable to provide staff with urine sample to determine/confirm any illicit substances in her system. This provider as well as RN, Emergency planning/management officer, and SW attempted to evaluate patient, however she would not wake up. The RN tried physical and verbal stimulation numerous times, however she did not wake up nor respond to any commands. She is unable to participate in assessment at this time.   ED team understanding if patient wakes up and is able to engage in conversation, to notify TTS to try again for psychiatric evaluation.   Plan:  -Pt likely will need inpatient treatment based on previous encounter history - Will start zyprexa 5 mg at bedtime to assist with psychotic symptoms

## 2024-04-14 NOTE — ED Provider Notes (Signed)
 [facility] Provider Note    Event Date/Time   First MD Initiated Contact with Patient 04/14/24 [phone]     (approximate)   History   Psychiatric Evaluation   HPI  Jenna [name] is a 18 y.o. female   Past medical history of bipolar, substance-induced psychosis, here with police under IVC for erratic bizarre behavior at a local motel.  Apparently she was running around seeming to talk to herself and responding to internal stimuli.  She was brought in police custody to the emergency department.  She is screaming out incoherently flailing all of her extremities and unable to cooperate with interview or exam.  She is unable to be redirected.  She is in handcuffs and throwing herself wildly about on the stretcher.  At times she will begin singing.  She appears to be talking to herself when left alone in the room, appearing to respond to internal stimuli.  Further discussion is limited by the patient's altered state.  Independent Historian contributed to assessment above: Police give report as above  External Medical Documents Reviewed: Police IVC report      Physical Exam   Triage Vital Signs: ED Triage Vitals [04/14/24 0600]  Encounter Vitals Group     BP      Systolic BP Percentile      Diastolic BP Percentile      Pulse      Resp      Temp      Temp src      SpO2      Weight 150 lb (68 kg)     Height 5\' 7"  (1.702 m)     Head Circumference      Peak Flow      Pain Score 0     Pain Loc      Pain Education      Exclude from Growth Chart     Most recent vital signs: There were no vitals filed for this visit.  General: Awake, moving all extremities.  No obvious head trauma.  She does have some superficial lacerations to the left forearm that do not appear infected and do not require repair.  She has a soft abdomen without rigidity or guarding.   ED Results / Procedures / Treatments   Labs (all labs ordered are listed, but  only abnormal results are displayed) Labs Reviewed  ACETAMINOPHEN LEVEL  COMPREHENSIVE METABOLIC PANEL WITH GFR  SALICYLATE LEVEL  CBC WITH DIFFERENTIAL/PLATELET  URINE DRUG SCREEN, QUALITATIVE ([facility] ONLY)  HCG, QUANTITATIVE, PREGNANCY  ETHANOL  POC URINE PREG, ED     PROCEDURES:  Critical Care performed: No  Procedures   MEDICATIONS ORDERED IN ED: Medications  midazolam (VERSED) 5 MG/5ML injection 5 mg (5 mg Intramuscular Given 04/14/24 0601)  droperidol (INAPSINE) 2.5 MG/ML injection 5 mg (5 mg Intramuscular Given 04/14/24 0601)     IMPRESSION / MDM / ASSESSMENT AND PLAN / ED COURSE  I reviewed the triage vital signs and the nursing notes.                                Patient's presentation is most consistent with acute presentation with potential threat to life or bodily function.  Differential diagnosis includes, but is not limited to, acute decompensated psychiatric illness, substance-induced psychiatric disorder   MDM:    This is a patient with psychiatric history and also substance-induced psychosis here exhibiting signs of psychosis  responding to internal stimuli, flailing about wildly posing a danger to both herself and her staff unable to be verbally redirected and so she was given a therapeutic dose of IM medications for her agitation and to facilitate further evaluation and treatment planning.  She responded by sleeping.  I was able to perform a physical exam she is maintaining her airway, breathing comfortably, no obvious signs of head trauma and I did see some superficial lacerations to the forearm not requiring repair.  She is under IVC by police and I am in agreement.  I have put in for psychiatric consultation when she is more awake to engage.  I have ordered for basic labs and toxicologic labs as well as pregnancy test.       FINAL CLINICAL IMPRESSION(S) / ED DIAGNOSES   Final diagnoses:  Psychosis, unspecified psychosis type (HCC)     Rx  / DC Orders   ED Discharge Orders     None        Note:  This document was prepared using Dragon voice recognition software and may include unintentional dictation errors.    Buell Carmin, MD 04/14/24 [phone]

## 2024-04-14 NOTE — ED Notes (Signed)
 Pt dressed out into burgundy scrubs with this tech, Josslyn, RN, Carolynne Citron, RN, and Burdette Carolin, RN in the rm. Pt belongings consist of" one grey jacket, one white shirt, one pair of leggings, black thongs, one sock, and white crocs. Pt belongings placed into one pt belongings bag and labeled with pt name.

## 2024-04-14 NOTE — ED Notes (Signed)
Pt declined snack and drink

## 2024-04-14 NOTE — ED Notes (Signed)
 Pt continues sleeping, is arousable to stimuli, continues to refuse to participate in assessment. Will cont to monitor as ordered

## 2024-04-15 DIAGNOSIS — F29 Unspecified psychosis not due to a substance or known physiological condition: Secondary | ICD-10-CM

## 2024-04-15 LAB — URINALYSIS, COMPLETE (UACMP) WITH MICROSCOPIC
Bilirubin Urine: NEGATIVE
Glucose, UA: NEGATIVE mg/dL
Ketones, ur: 5 mg/dL — AB
Nitrite: POSITIVE — AB
Protein, ur: 100 mg/dL — AB
Specific Gravity, Urine: 1.014 (ref 1.005–1.030)
WBC, UA: >50 WBC/hpf (ref 0–5)
pH: 6 (ref 5.0–8.0)

## 2024-04-15 LAB — URINE DRUG SCREEN, QUALITATIVE (ARMC ONLY)
Amphetamines, Ur Screen: NOT DETECTED
Barbiturates, Ur Screen: NOT DETECTED
Benzodiazepine, Ur Scrn: POSITIVE — AB
Cannabinoid 50 Ng, Ur ~~LOC~~: NOT DETECTED
Cocaine Metabolite,Ur ~~LOC~~: POSITIVE — AB
MDMA (Ecstasy)Ur Screen: NOT DETECTED
Methadone Scn, Ur: NOT DETECTED
Opiate, Ur Screen: NOT DETECTED
Phencyclidine (PCP) Ur S: NOT DETECTED
Tricyclic, Ur Screen: NOT DETECTED

## 2024-04-15 LAB — POC URINE PREG, ED: Preg Test, Ur: NEGATIVE

## 2024-04-15 NOTE — ED Notes (Signed)
Pt given beverage at this time.

## 2024-04-15 NOTE — ED Notes (Signed)
 Pt came out mad. Wanting to leave. Screeming about wanting to get out of here. Explained that the d/c has not come from the MD.

## 2024-04-15 NOTE — ED Provider Notes (Signed)
 Emergency Medicine Observation Re-evaluation Note  Jenna [name] is a 18 y.o. female, seen on rounds today.  Pt initially presented to the ED for complaints of Psychiatric Evaluation Currently, the patient is resting, voices no medical complaints.  Physical Exam  BP 126/85 (BP Location: Right Arm)   Pulse 94   Temp 98.3 F (36.8 C) (Oral)   Resp 18   Ht 5\' 7"  (1.702 m)   Wt 68 kg   SpO2 97%   BMI 23.49 kg/m  Physical Exam General: Resting in no acute distress Cardiac: No cyanosis Lungs: Equal rise and fall Psych: Not agitated  ED Course / MDM  EKG:   I have reviewed the labs performed to date as well as medications administered while in observation.  Recent changes in the last 24 hours include no events overnight.  Plan  Current plan is for psychiatric disposition.    Lathan Gieselman J, MD 04/15/24 [phone]

## 2024-04-15 NOTE — ED Notes (Signed)
 IVC/  PENDING  PLACEMENT

## 2024-04-15 NOTE — Consult Note (Signed)
 [facility] Psychiatric Consult Follow-up  Patient Name: .Jenna [name]  MRN: [id number]  DOB: [birth year]  Consult Order details:  Orders (From admission, onward)     Start     Ordered   04/14/24 0612  IP CONSULT TO PSYCHIATRY       Ordering Provider: Buell Carmin, MD  Provider:  (Not yet assigned)  Question Answer Comment  Place call to: ED   Reason for Consult Admit      04/14/24 0612   04/14/24 0612  CONSULT TO CALL ACT TEAM       Ordering Provider: Buell Carmin, MD  Provider:  (Not yet assigned)  Question:  Reason for Consult?  Answer:  Psych consult   04/14/24 0612             Mode of Visit: Tele-visit Virtual Statement:TELE PSYCHIATRY ATTESTATION & CONSENT As the provider for this telehealth consult, I attest that I verified the patient's identity using two separate identifiers, introduced myself to the patient, provided my credentials, disclosed my location, and performed this encounter via a HIPAA-compliant, real-time, face-to-face, two-way, interactive audio and video platform and with the full consent and agreement of the patient (or guardian as applicable.) Patient physical location: [facility]. Telehealth provider physical location: home office in state of [place].   Video start time: 12:20 PM Video end time: 1 PM    Psychiatry Consult Evaluation  Service Date: April 16, 2024 LOS:  LOS: 0 days  Chief Complaint "I didn't have my medication for my hallucination"  Primary Psychiatric Diagnoses  Psychosis   Assessment  Jenna [name] is a 18 y.o. female presenting to [facility] ED on 04/14/2024 at 6:02 AM under involuntary commitment due to psychosis and bizarre behavior.  Patient reportedly experiencing hallucinations and running in the road prior to being picked up by police and transported to the emergency department. Patient was unable to be redirected and required IM medication for calming. Patient reports experiencing recent visual hallucinations,  seeing Alanda Hoyles.  She reports auditory hallucinations stating that the voices tell her to run. Patient has a psychiatric history of bipolar 1 disorder and substance-induced psychosis.  Patient reports previous psychiatric hospitalization.  Patient noted with a history of experiencing hallucinations and displaying unsafe behaviors.  Patient previously presented to [facility] ED on 02/27/2024 due to substance induced psychosis after experiencing hallucinations, running traffic and grabbing a police officer's firearm. She denies recent drug use but later states that she used "cocaine 2 to 3 days ago and marijuana a month or 2 ago".  Provided history inconsistent at times likely due to secondary gain to discharge.  She reports being prescribed Abilify and trazodone for symptom management but states that she has not taken the medication in the past month.  Patient reports being recommended to [facility] for outpatient psychiatry but did not follow up post discharge from her last psychiatric hospitalization. She denies SI, HI, paranoia or delusional thought. Patient's unsafe behavior raises concern for patient's safety.  Given patient's psychiatric history and unsafe behaviors, patient is recommended for inpatient psychiatry when she is medically cleared.  Diagnoses:  Active Hospital problems: Active Problems:   Psychosis [facility])    Plan   ## Psychiatric Medication Recommendations:  Continue Zyprexa    ## Medical Decision Making Capacity: Not specifically addressed in this encounter  ## Further Work-up:  -- Deferred to ADP EKG -- most recent EKG on 04/15/2024 had QtC of 466 -- Pertinent labwork reviewed earlier this admission includes: CMP, CBC, ethyl alcohol and salicylate,  Tylenol  level, UDS   ## Disposition:-- We recommend inpatient psychiatric hospitalization after medical hospitalization. Patient has been involuntarily committed on 04/15/2024.   ## Behavioral / Environmental: - No specific recommendations at  this time.     ## Safety and Observation Level:  - Based on my clinical evaluation, I estimate the patient to be at low risk of self harm in the current setting. - At this time, we recommend routine. This decision is based on my review of the chart including patient's history and current presentation, interview of the patient, mental status examination, and consideration of suicide risk including evaluating suicidal ideation, plan, intent, suicidal or self-harm behaviors, risk factors, and protective factors. This judgment is based on our ability to directly address suicide risk, implement suicide prevention strategies, and develop a safety plan while the patient is in the clinical setting. Please contact our team if there is a concern that risk level has changed.  CSSR Risk Category:C-SSRS RISK CATEGORY: No Risk  Suicide Risk Assessment: Patient has following modifiable risk factors for suicide: recklessness and medication noncompliance, which we are addressing by inpatient psychiatry. Patient has following non-modifiable or demographic risk factors for suicide: psychiatric hospitalization Patient has the following protective factors against suicide: no history of suicide attempts  Thank you for this consult request. Recommendations have been communicated to the primary team.  We will recommend inpatient psychiatry at this time.   Monroe Antigua, NP       History of Present Illness  Relevant Aspects of Hospital ED Course:  Admitted on 04/14/2024 for psychosis and bizarre behavior  Patient Report:  I want to go home  Psych ROS:  Depression: Denies Anxiety: Denies Mania (lifetime and current): Patient has a history of bipolar 1 disorder Psychosis: (lifetime and current): AVH. Voices tell her to "run, catch me if you can". "I see Barney". Last experienced AVH last night.  Collateral information:  Contacted patient's mother without success.  Review of Systems  Psychiatric/Behavioral:   Positive for hallucinations and substance abuse. Negative for suicidal ideas.   All other systems reviewed and are negative.    Psychiatric and Social History  Psychiatric History:  Information collected from patient and ED treatment.  Prev Dx/Sx: Bipolar 1 disorder Current Psych Provider: denies Home Meds (current): none. Has not taken Abilify or trazdone in the past month. Previous Med Trials: Abilify and trazodone Therapy: yes. "I always had therapy all my life growing up. Never helped though"  Prior Psych Hospitalization: yes. 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 paraphernalia Living Situation: homeless Spiritual Hx: none Access to weapons/lethal means: denies   Substance History Alcohol: denies  Type of alcohol n/a Last Drink n/a Number of drinks per day n/a History of alcohol withdrawal seizures denies History of DT's denies Tobacco: cigarettes 1-2 packs per day Illicit drugs: cocaine last used 2-3 days ago Prescription drug abuse: denies Rehab hx: denies  Exam Findings  Physical Exam: no abnormalities observed  Vital Signs:  Temp:  [97.8 F (36.6 C)-98.2 F (36.8 C)] 98.2 F (36.8 C) (04/17 2145) Pulse Rate:  [58-90] 58 (04/17 2145) Resp:  [18] 18 (04/17 2145) BP: (101-110)/(75) 106/75 (04/17 2145) SpO2:  [99 %] 99 % (04/17 2145) Blood pressure 106/75, pulse (!) 58, temperature 98.2 F (36.8 C),  temperature source Oral, resp. rate 18, height 5\' 7"  (1.702 m), weight 68 kg, SpO2 99%, unknown if currently breastfeeding. Body mass index is 23.49 kg/m.  Physical Exam Vitals and nursing note reviewed.  Constitutional:      Appearance: Normal appearance.  Neurological:     General: No focal deficit present.     Mental  Status: She is alert and oriented to person, place, and time.     Mental Status Exam: General Appearance: Disheveled  Orientation: Full orientation  Memory: Good  Concentration: Good  Recall: Good  Attention good  Eye Contact: Fair  Speech: Clear and coherent  Language: Good  Volume:  Normal  Mood: "a 10, I just want to go home"  Affect:  Congruent  Thought Process:  Goal Directed  Thought Content:  Hallucinations: Auditory Visual  Suicidal Thoughts:  No  Homicidal Thoughts:  No  Judgement:  Poor  Insight:  Lacking  Psychomotor Activity:  Normal  Akathisia:  NA  Fund of Knowledge:  Good      Assets:  Communication Skills  Cognition:  WNL  ADL's:  Intact  AIMS (if indicated):        Other History   These have been pulled in through the EMR, reviewed, and updated if appropriate.  Family History:  The patient's family history includes Diabetes in her father, maternal grandmother, and mother; Hypertension in her maternal grandmother.  Medical History: Past Medical History:  Diagnosis Date   Bipolar 1 disorder (HCC) dx'd 2019 07/11/2021   Diet controlled gestational diabetes mellitus (GDM) in third trimester 07/26/2022   Gestational diabetes     Surgical History: History reviewed. No pertinent surgical history.   Medications:   Current Facility-Administered Medications:    nitrofurantoin  (macrocrystal-monohydrate) (MACROBID ) capsule 100 mg, 100 mg, Oral, Q12H, Goodman, Graydon, MD   OLANZapine  (ZYPREXA ) tablet 5 mg, 5 mg, Oral, QHS, Coleman, Mikaela, NP, 5 mg at 04/15/24 2137  Current Outpatient Medications:    ARIPiprazole (ABILIFY) 10 MG tablet, Take 10 mg by mouth daily., Disp: , Rfl:    traZODone (DESYREL) 150 MG tablet, Take 150 mg by mouth at bedtime., Disp: , Rfl:    naloxone  (NARCAN ) nasal spray 4 mg/0.1 mL, Use in the event of suspected overdose of heroin or opiates, Disp: 1 each, Rfl: 0  Allergies: No Known Allergies  Monroe Antigua, NP

## 2024-04-15 NOTE — ED Notes (Signed)
Pt given meal tray and beverage at this time

## 2024-04-15 NOTE — ED Notes (Signed)
 Pt given urine cup to give urine sample.

## 2024-04-15 NOTE — ED Notes (Signed)
 Pt was reportedly agitated and aggressive right before shift change, since she was told she has to stay longer for further eval. Refused EKG earlier, remains asleep at this time.

## 2024-04-16 MED ORDER — NITROFURANTOIN MONOHYD MACRO 100 MG PO CAPS
100.0000 mg | ORAL_CAPSULE | Freq: Two times a day (BID) | ORAL | Status: DC
Start: 2024-04-16 — End: 2024-04-16
  Administered 2024-04-16: 100 mg via ORAL
  Filled 2024-04-16: qty 1

## 2024-04-16 NOTE — ED Notes (Signed)
 Report given to Rahabu [facility] RN

## 2024-04-16 NOTE — ED Notes (Signed)
Lunch tray provided with beverage 

## 2024-04-16 NOTE — BH Assessment (Signed)
 PATIENT BED AVAILABLE AFTER 12PM ON 04/16/24  Patient has been accepted to [facility]  Patient assigned to [address] Accepting physician is Dr. Emilie Harden.  Call report to [phone].  Representative was [employer] .   ER Staff is aware of it:  [facility] ER Secretary  Dr. Hendrick Locke, ER MD  Antony Baumgartner Patient's Nurse

## 2024-04-16 NOTE — ED Notes (Signed)
Breakfast tray and milk provided

## 2024-04-16 NOTE — ED Notes (Signed)
 Called in to c com for after 12 pick up  by sheriff's transport   [phone]

## 2024-04-16 NOTE — ED Notes (Signed)
 ivc/Patient has been accepted to [facility]. PATIENT BED AVAILABLE AFTER 12PM ON 04/16/24

## 2024-04-16 NOTE — BH Assessment (Signed)
 Per [facility] [name], patient to be referred out of system.  Referral information for Psychiatric Hospitalization faxed to;   [facility] [phone]- [phone]) No available beds  Jenna [name] [phone]- [phone]),   Jenna [name] [phone]),  [address] [phone]),   [name] [phone] -or- [phone]),   Jenna [name] [phone])  [facility] [phone])

## 2024-04-16 NOTE — ED Notes (Addendum)
 Melanie from [facility] called for update on pt's behavior as she is possibly candidate to transfer there. Explained that pt has been compliant and without any IM meds or restraints for nearly 48hrs. She will call with further updates later on.

## 2024-04-16 NOTE — ED Provider Notes (Signed)
 Emergency Medicine Observation Re-evaluation Note  Jenna [name] is a 18 y.o. female, awaiting psych dispo  Physical Exam  BP 106/75 (BP Location: Left Arm)   Pulse (!) 58   Temp 98.2 F (36.8 C) (Oral)   Resp 18   Ht 5\' 7"  (1.702 m)   Wt 68 kg   SpO2 99%   BMI 23.49 kg/m  Physical Exam General: resting comfortably  ED Course / MDM  UA was concerning for infection so antibiotics were ordered  Plan  Current plan is for psych dispo.    Marylynn Soho, MD 04/16/24 [phone]

## 2024-10-18 ENCOUNTER — Ambulatory Visit: Payer: MEDICAID
